# Patient Record
Sex: Male | Born: 1935 | Race: White | Hispanic: No | Marital: Single
Health system: Southern US, Community
[De-identification: ages and names within clinical notes are randomized; demographics above are authoritative.]

---

## 2010-09-10 ENCOUNTER — Ambulatory Visit: Payer: Self-pay | Admitting: Internal Medicine

## 2011-04-09 ENCOUNTER — Ambulatory Visit: Payer: Self-pay | Admitting: Internal Medicine

## 2012-10-30 ENCOUNTER — Emergency Department: Payer: Self-pay | Admitting: Internal Medicine

## 2012-10-30 LAB — COMPREHENSIVE METABOLIC PANEL
Anion Gap: 10 (ref 7–16)
Bilirubin,Total: 1 mg/dL (ref 0.2–1.0)
Calcium, Total: 9.5 mg/dL (ref 8.5–10.1)
Co2: 23 mmol/L (ref 21–32)
Creatinine: 0.85 mg/dL (ref 0.60–1.30)
EGFR (African American): >60
Glucose: 92 mg/dL (ref 65–99)
Osmolality: 279 (ref 275–301)
Potassium: 4.1 mmol/L (ref 3.5–5.1)
Sodium: 139 mmol/L (ref 136–145)
Total Protein: 7.8 g/dL (ref 6.4–8.2)

## 2012-10-30 LAB — CBC
HCT: 42.6 % (ref 40.0–52.0)
HGB: 14.5 g/dL (ref 13.0–18.0)
MCH: 34.1 pg — ABNORMAL HIGH (ref 26.0–34.0)
MCHC: 34 g/dL (ref 32.0–36.0)
WBC: 8.3 10*3/uL (ref 3.8–10.6)

## 2012-10-30 LAB — URINALYSIS, COMPLETE
Bacteria: NONE SEEN
Bilirubin,UR: NEGATIVE
Blood: NEGATIVE
Leukocyte Esterase: NEGATIVE
Nitrite: NEGATIVE
Ph: 7 (ref 4.5–8.0)
RBC,UR: 1 /HPF (ref 0–5)
Specific Gravity: 1.017 (ref 1.003–1.030)
WBC UR: 1 /HPF (ref 0–5)

## 2012-10-30 LAB — CK TOTAL AND CKMB (NOT AT ARMC)
CK, Total: 239 U/L — ABNORMAL HIGH (ref 35–232)
CK-MB: 3.3 ng/mL (ref 0.5–3.6)

## 2013-07-06 ENCOUNTER — Ambulatory Visit: Payer: Self-pay | Admitting: Internal Medicine

## 2013-08-01 ENCOUNTER — Inpatient Hospital Stay: Payer: Self-pay

## 2013-08-02 LAB — BASIC METABOLIC PANEL
Anion Gap: 7 (ref 7–16)
BUN: 39 mg/dL — AB (ref 7–18)
CALCIUM: 8.4 mg/dL — AB (ref 8.5–10.1)
CHLORIDE: 115 mmol/L — AB (ref 98–107)
CO2: 23 mmol/L (ref 21–32)
Creatinine: 1.56 mg/dL — ABNORMAL HIGH (ref 0.60–1.30)
GFR CALC AF AMER: 48 — AB
GFR CALC NON AF AMER: 42 — AB
Glucose: 94 mg/dL (ref 65–99)
Osmolality: 298 (ref 275–301)
Potassium: 4.6 mmol/L (ref 3.5–5.1)
SODIUM: 145 mmol/L (ref 136–145)

## 2013-08-02 LAB — CBC WITH DIFFERENTIAL/PLATELET
BASOS ABS: 0 10*3/uL (ref 0.0–0.1)
BASOS PCT: 0.4 %
EOS ABS: 0.1 10*3/uL (ref 0.0–0.7)
Eosinophil %: 1.6 %
HCT: 32.5 % — ABNORMAL LOW (ref 40.0–52.0)
HGB: 10.7 g/dL — AB (ref 13.0–18.0)
LYMPHS PCT: 15.5 %
Lymphocyte #: 1 10*3/uL (ref 1.0–3.6)
MCH: 35.3 pg — ABNORMAL HIGH (ref 26.0–34.0)
MCHC: 32.9 g/dL (ref 32.0–36.0)
MCV: 107 fL — ABNORMAL HIGH (ref 80–100)
MONO ABS: 1 x10 3/mm (ref 0.2–1.0)
Monocyte %: 15.4 %
NEUTROS ABS: 4.3 10*3/uL (ref 1.4–6.5)
Neutrophil %: 67.1 %
Platelet: 98 10*3/uL — ABNORMAL LOW (ref 150–440)
RBC: 3.03 10*6/uL — AB (ref 4.40–5.90)
RDW: 13.4 % (ref 11.5–14.5)
WBC: 6.4 10*3/uL (ref 3.8–10.6)

## 2013-08-02 LAB — PROTIME-INR
INR: 1.4
PROTHROMBIN TIME: 16.8 s — AB (ref 11.5–14.7)

## 2013-08-03 LAB — BASIC METABOLIC PANEL
ANION GAP: 7 (ref 7–16)
BUN: 37 mg/dL — ABNORMAL HIGH (ref 7–18)
CHLORIDE: 115 mmol/L — AB (ref 98–107)
CREATININE: 1.2 mg/dL (ref 0.60–1.30)
Calcium, Total: 8.2 mg/dL — ABNORMAL LOW (ref 8.5–10.1)
Co2: 23 mmol/L (ref 21–32)
EGFR (African American): >60
EGFR (Non-African Amer.): 57 — ABNORMAL LOW
Glucose: 113 mg/dL — ABNORMAL HIGH (ref 65–99)
OSMOLALITY: 298 (ref 275–301)
Potassium: 4.2 mmol/L (ref 3.5–5.1)
SODIUM: 145 mmol/L (ref 136–145)

## 2013-08-04 LAB — BASIC METABOLIC PANEL
Anion Gap: 5 — ABNORMAL LOW (ref 7–16)
BUN: 33 mg/dL — ABNORMAL HIGH (ref 7–18)
CALCIUM: 8.4 mg/dL — AB (ref 8.5–10.1)
CREATININE: 0.98 mg/dL (ref 0.60–1.30)
Chloride: 116 mmol/L — ABNORMAL HIGH (ref 98–107)
Co2: 25 mmol/L (ref 21–32)
EGFR (African American): >60
EGFR (Non-African Amer.): >60
Glucose: 124 mg/dL — ABNORMAL HIGH (ref 65–99)
OSMOLALITY: 299 (ref 275–301)
POTASSIUM: 4 mmol/L (ref 3.5–5.1)
SODIUM: 146 mmol/L — AB (ref 136–145)

## 2013-08-04 LAB — HEPATIC FUNCTION PANEL A (ARMC)
AST: 107 U/L — AB (ref 15–37)
Albumin: 2.3 g/dL — ABNORMAL LOW (ref 3.4–5.0)
Alkaline Phosphatase: 97 U/L
Bilirubin, Direct: 0.5 mg/dL — ABNORMAL HIGH (ref 0.00–0.20)
Bilirubin,Total: 1.1 mg/dL — ABNORMAL HIGH (ref 0.2–1.0)
SGPT (ALT): 53 U/L (ref 12–78)
Total Protein: 6.6 g/dL (ref 6.4–8.2)

## 2013-08-04 LAB — AMMONIA: Ammonia, Plasma: 95 mcmol/L — ABNORMAL HIGH (ref 11–32)

## 2013-08-05 ENCOUNTER — Ambulatory Visit: Payer: Self-pay | Admitting: Internal Medicine

## 2013-08-05 LAB — BASIC METABOLIC PANEL
ANION GAP: 5 — AB (ref 7–16)
BUN: 31 mg/dL — ABNORMAL HIGH (ref 7–18)
Calcium, Total: 8.4 mg/dL — ABNORMAL LOW (ref 8.5–10.1)
Chloride: 115 mmol/L — ABNORMAL HIGH (ref 98–107)
Co2: 26 mmol/L (ref 21–32)
Creatinine: 0.95 mg/dL (ref 0.60–1.30)
EGFR (Non-African Amer.): >60
GLUCOSE: 120 mg/dL — AB (ref 65–99)
Osmolality: 298 (ref 275–301)
POTASSIUM: 4 mmol/L (ref 3.5–5.1)
Sodium: 146 mmol/L — ABNORMAL HIGH (ref 136–145)

## 2013-08-06 LAB — BASIC METABOLIC PANEL
Anion Gap: 5 — ABNORMAL LOW (ref 7–16)
BUN: 31 mg/dL — AB (ref 7–18)
Calcium, Total: 8.6 mg/dL (ref 8.5–10.1)
Chloride: 115 mmol/L — ABNORMAL HIGH (ref 98–107)
Co2: 24 mmol/L (ref 21–32)
Creatinine: 0.92 mg/dL (ref 0.60–1.30)
GLUCOSE: 120 mg/dL — AB (ref 65–99)
OSMOLALITY: 295 (ref 275–301)
POTASSIUM: 4.2 mmol/L (ref 3.5–5.1)
Sodium: 144 mmol/L (ref 136–145)

## 2013-08-07 ENCOUNTER — Ambulatory Visit: Payer: Self-pay | Admitting: Urology

## 2013-08-07 LAB — BASIC METABOLIC PANEL
ANION GAP: 5 — AB (ref 7–16)
BUN: 35 mg/dL — ABNORMAL HIGH (ref 7–18)
CALCIUM: 8.4 mg/dL — AB (ref 8.5–10.1)
CREATININE: 1.24 mg/dL (ref 0.60–1.30)
Chloride: 115 mmol/L — ABNORMAL HIGH (ref 98–107)
Co2: 26 mmol/L (ref 21–32)
EGFR (African American): >60
EGFR (Non-African Amer.): 55 — ABNORMAL LOW
Glucose: 122 mg/dL — ABNORMAL HIGH (ref 65–99)
Osmolality: 300 (ref 275–301)
Potassium: 4.1 mmol/L (ref 3.5–5.1)
Sodium: 146 mmol/L — ABNORMAL HIGH (ref 136–145)

## 2013-08-07 LAB — CBC WITH DIFFERENTIAL/PLATELET
BASOS ABS: 0 10*3/uL (ref 0.0–0.1)
Basophil %: 0.4 %
EOS ABS: 0.1 10*3/uL (ref 0.0–0.7)
Eosinophil %: 1.3 %
HCT: 34.9 % — ABNORMAL LOW (ref 40.0–52.0)
HGB: 11.2 g/dL — ABNORMAL LOW (ref 13.0–18.0)
Lymphocyte #: 1 10*3/uL (ref 1.0–3.6)
Lymphocyte %: 10.3 %
MCH: 34.7 pg — AB (ref 26.0–34.0)
MCHC: 32 g/dL (ref 32.0–36.0)
MCV: 109 fL — ABNORMAL HIGH (ref 80–100)
Monocyte #: 1.3 x10 3/mm — ABNORMAL HIGH (ref 0.2–1.0)
Monocyte %: 13.9 %
Neutrophil #: 6.8 10*3/uL — ABNORMAL HIGH (ref 1.4–6.5)
Neutrophil %: 74.1 %
Platelet: 136 10*3/uL — ABNORMAL LOW (ref 150–440)
RBC: 3.22 10*6/uL — AB (ref 4.40–5.90)
RDW: 13.3 % (ref 11.5–14.5)
WBC: 9.2 10*3/uL (ref 3.8–10.6)

## 2013-08-08 LAB — BASIC METABOLIC PANEL
Anion Gap: 5 — ABNORMAL LOW (ref 7–16)
BUN: 43 mg/dL — ABNORMAL HIGH (ref 7–18)
CALCIUM: 8.7 mg/dL (ref 8.5–10.1)
Chloride: 117 mmol/L — ABNORMAL HIGH (ref 98–107)
Co2: 26 mmol/L (ref 21–32)
Creatinine: 1.3 mg/dL (ref 0.60–1.30)
EGFR (African American): >60
GFR CALC NON AF AMER: 52 — AB
Glucose: 120 mg/dL — ABNORMAL HIGH (ref 65–99)
OSMOLALITY: 306 (ref 275–301)
Potassium: 4.1 mmol/L (ref 3.5–5.1)
SODIUM: 148 mmol/L — AB (ref 136–145)

## 2013-09-05 ENCOUNTER — Ambulatory Visit: Payer: Self-pay | Admitting: Internal Medicine

## 2013-09-05 DEATH — deceased

## 2014-02-12 IMAGING — CT CT CERVICAL SPINE WITHOUT CONTRAST
1 series · 12 of 14 positions shown, 15 images · non-contrast
Comparison: none

REASON FOR EXAM: pain l leg
COMMENTS:

PROCEDURE:     CT  - CT CERVICAL SPINE WO  - October 30, 2012  [DATE]
RESULT:     CT cervical spine
TECHNIQUE: Helical 2 mm sections were obtained. Reconstructions were
performed utilizing a bone algorithm in coronal, sagittal, and axial planes.

[Series 5: axial · axial · 0.32mm/px · z∈[-383,-218]mm · 12 of 111 slices shown, 15 images]
[im 9/111  soft-tissue]
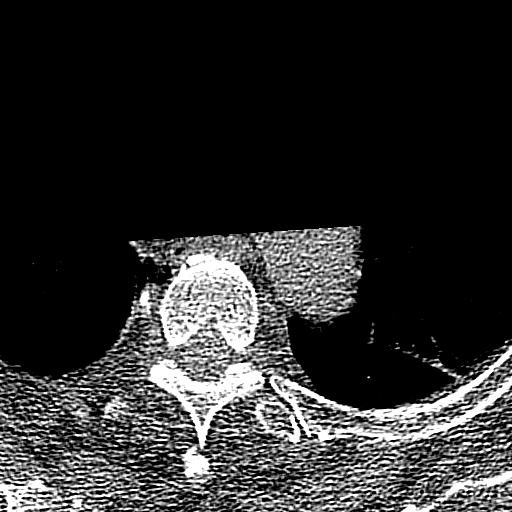
[im 9/111  bone]
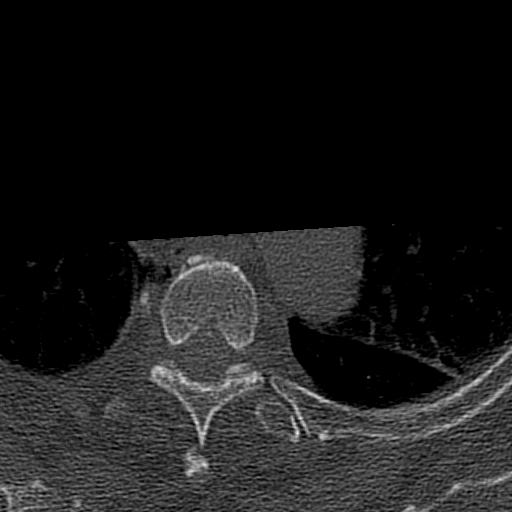
[im 17/111  bone]
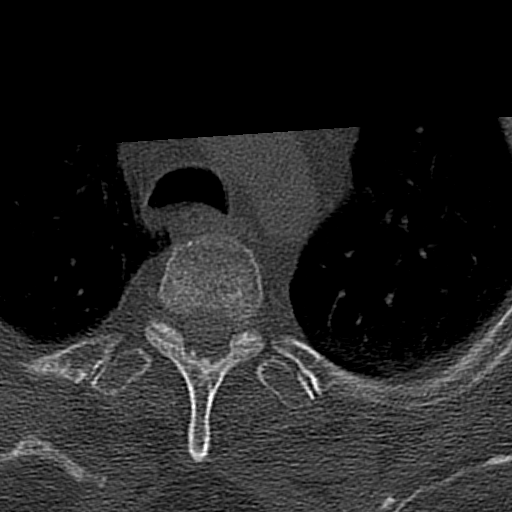
[im 26/111  bone]
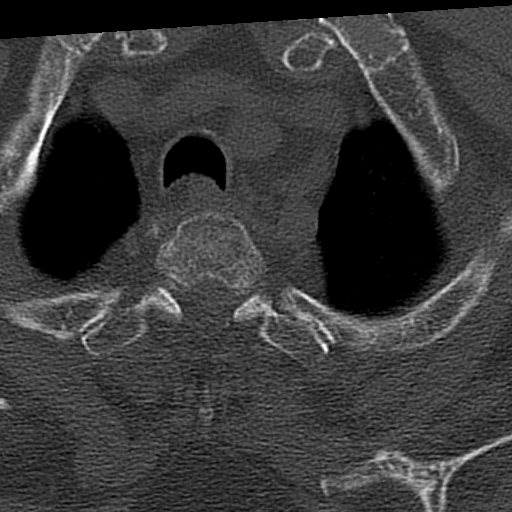
[im 34/111  bone]
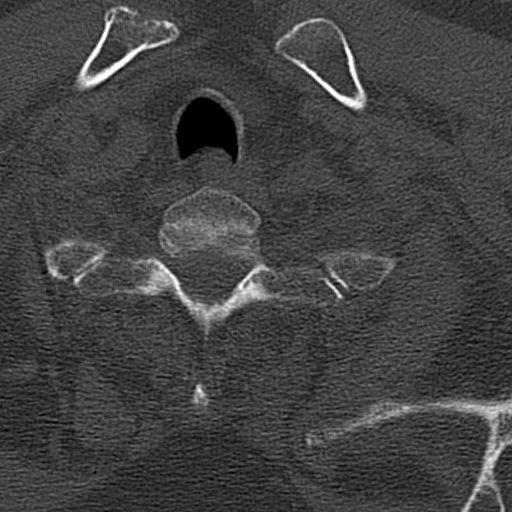
[im 43/111  soft-tissue]
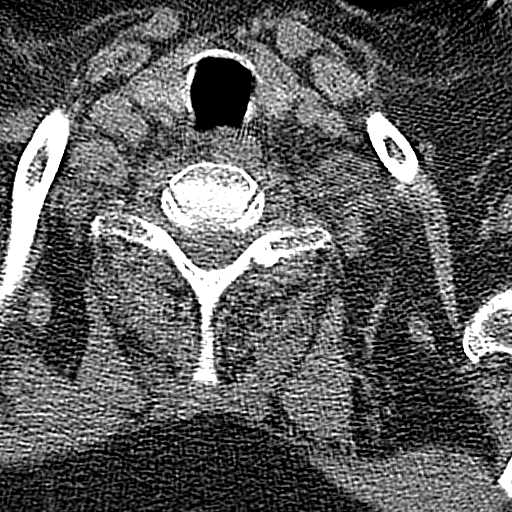
[im 43/111  bone]
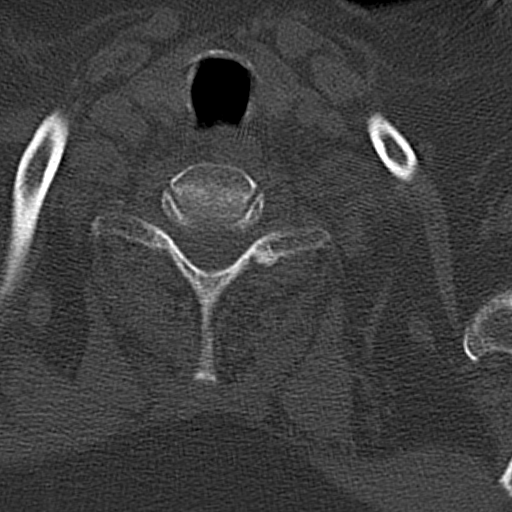
[im 51/111  bone]
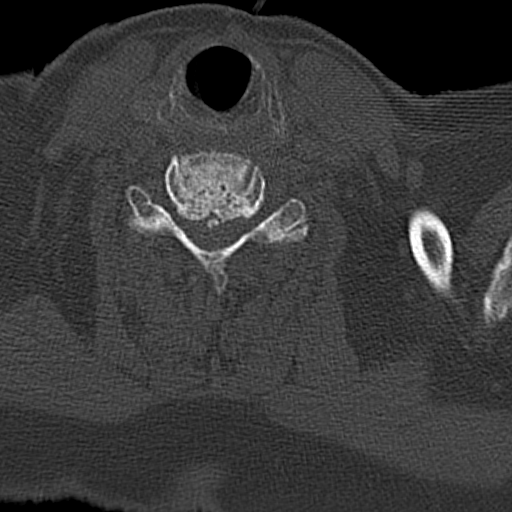
[im 60/111  bone]
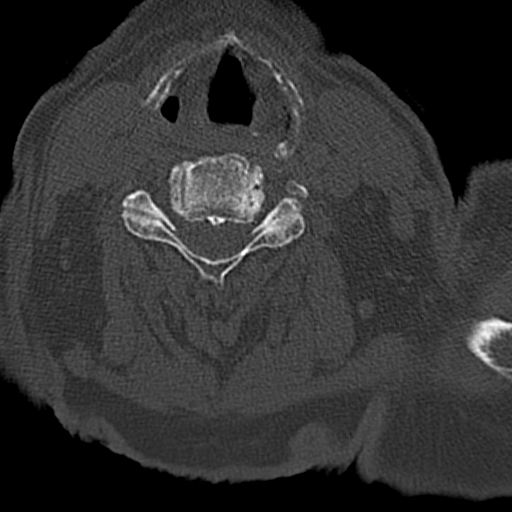
[im 68/111  bone]
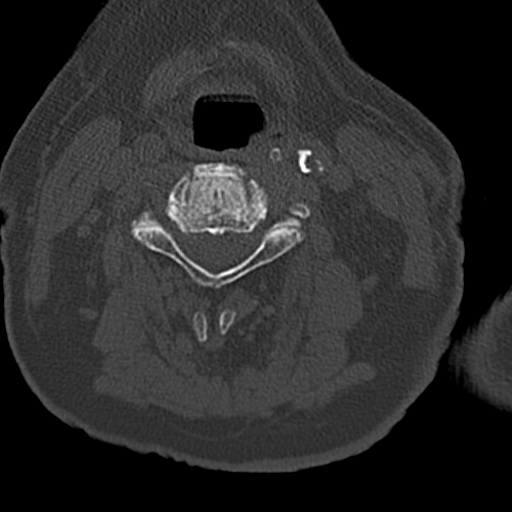
[im 77/111  soft-tissue]
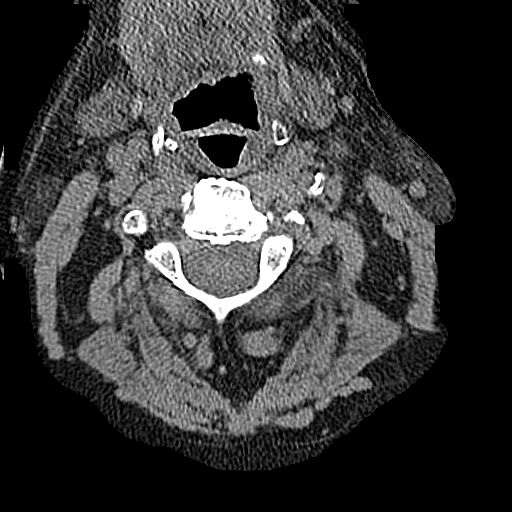
[im 77/111  bone]
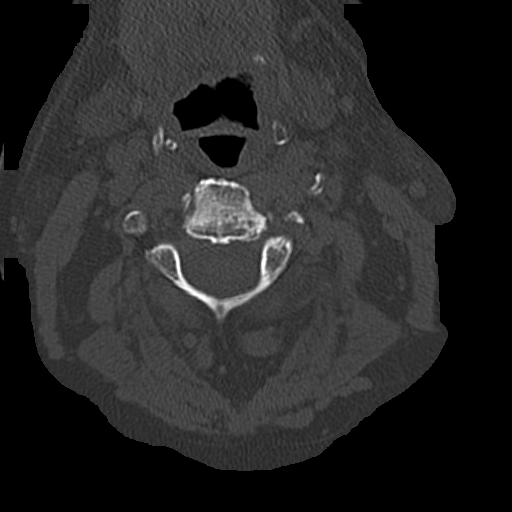
[im 85/111  bone]
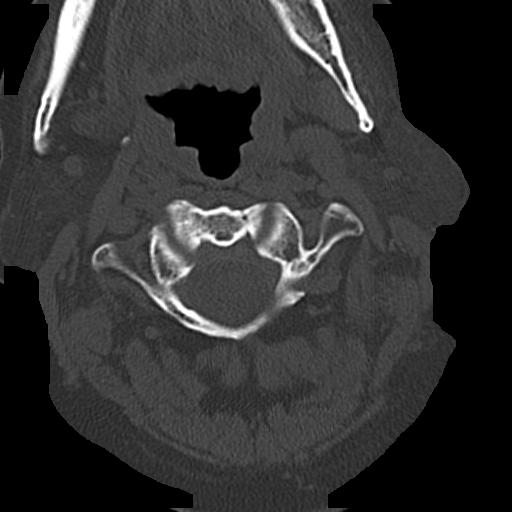
[im 94/111  bone]
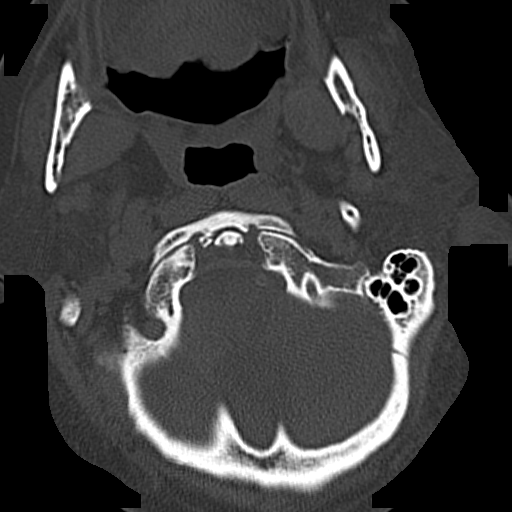
[im 102/111  bone]
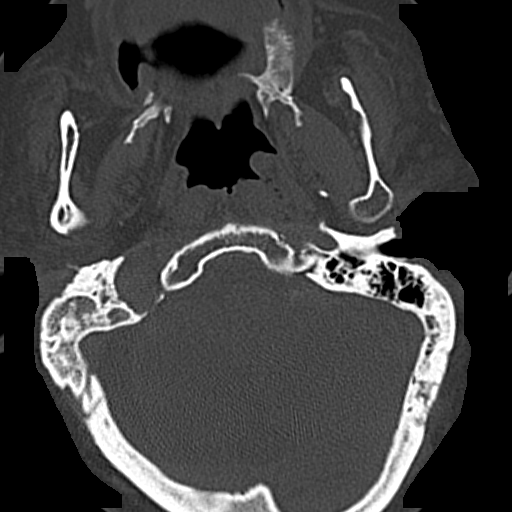

[12 of 14 positions shown; findings below may reference images not displayed]

FINDINGS: There is no evidence of acute fracture, dislocation, or
malalignment. There is no evidence of prevertebral soft tissue swelling nor
evidence of canal stenosis. Multilevel degenerative disc disease changes are
appreciated. Areas of calcification along the posterior longitudinal
ligament.
IMPRESSION: No CT evidence of acute osseous abnormalities.
2. Multilevel spondylolysis

## 2014-07-29 NOTE — Discharge Summary (Signed)
PATIENT NAME:  Phillip Owen, Phillip Owen MR#:  [id number] DATE OF BIRTH:  13-Oct-1932  DATE OF ADMISSION:  08/01/2013 DATE OF DISCHARGE:  08/08/2013  DISCHARGE DIAGNOSES: 1.  Cirrhosis.  2.  Dysphagia.  3.  Urinary retention.  4.  Anasarca.   HISTORY OF PRESENT ILLNESS: Please see initial history and physical for details. Briefly, this is a 81 year old mentally retarded gentleman who was admitted with increasing edema. It was felt initially that it might be due to CHF, but echocardiogram was normal, relatively. He was found to have cirrhosis based on imaging.   HOSPITAL COURSE BY ISSUE: Edema: The patient was gently diuresed; however, he continued to have anasarca. Due to progressive decline over the hospitalization, he was seen by speech and swallow and put on a mechanical soft diet, but he had very poor intake. The family met with palliative care and it was felt given the patient's progressive decline and his bedbound status he would not be a great candidate for extensive work-up of the cirrhosis, which was likely irreversible. He was also not a rehab candidate or candidate for SNF per physical therapy. The patient was discharged home on May 4th with home hospice.   DISCHARGE MEDICATIONS: Please see [facility] discharge summary hospice home admit orders.   DISCHARGE FOLLOWUP: The patient will be followed by hospice at home.   DISCHARGE CODE STATUS: DNR.   TIME SPENT: 40 minutes.  ____________________________ Cheral Marker. Ola Spurr, MD dpf:sb D: 08/25/2013 06:57:14 ET T: 08/25/2013 09:26:49 ET JOB#: [id number]  cc: Cheral Marker. Ola Spurr, MD, <Dictator> Terrill Wauters Ola Spurr MD ELECTRONICALLY SIGNED 08/28/2013 19:31

## 2014-07-29 NOTE — Consult Note (Signed)
Admit Diagnosis:   CHF: Onset Date: 07-Aug-2013, Status: Active, Description: CHF  Home Medications: Medication Instructions Status  omeprazole 20 mg oral delayed release capsule 1 cap(s) orally once a day Active  LORazepam 0.5 mg oral tablet 1 tab(s) orally every 4 hours as needed for anxiety. Active  loratadine 10 mg oral tablet 1 tab(s) orally once a day Active  meloxicam 7.5 mg oral tablet 1 tab(s) orally once a day Active  potassium chloride 10 mEq oral capsule, extended release 1 cap(s) orally 2 times a day Active  sertraline 25 mg oral tablet 1 tab(s) orally once a day Active  Vitamin B12 1000 mcg oral tablet 1 tab(s) orally once a day Active  Baza-Protect - topical cream Apply topically to affected area , As Needed with each diaper change. Active  Lasix 20 mg oral tablet 1  orally every 48 hours Active  Robitussin 10 milliliter(s) orally , As Needed Active  lisinopril 40 mg oral tablet 1 tab(s) orally once a day Active  hydrochlorothiazide 12.5 mg oral capsule 1 cap(s) orally once a day for fluid/edema. Active  fluticasone nasal 50 mcg/inh nasal spray 2 spray(s) in each nostril once a day Active  finasteride 5 mg oral tablet 1 tab(s) orally once a day for prostate. *do not crush -wear gloves when handling* Active  Lubriderm Daily Moisturizer - topical lotion Apply topically to both lower legs 2 times a day. Active  Travatan Z 0.004% ophthalmic solution 1 drop(s) in each eye once a day (at night) for glaucoma. Active  simvastatin 10 mg oral tablet 1 tab(s) orally once a day for cholesterol. Active  acetaminophen 500 mg oral tablet 2 tab(s) orally 3 times a day Active  tolterodine 2 mg oral tablet 1 tab(s) orally 2 times a day Active  dorzolamide-timolol ophthalmic 2.23%-0.68% ophthalmic solution 1 drop(s) into each eye 2 times a day. Active   Lab Results: Routine Chem:  03-May-15 04:56   Glucose, Serum  122  BUN  35  Creatinine (comp) 1.24  Sodium, Serum  146  Potassium,  Serum 4.1  Chloride, Serum  115  CO2, Serum 26  Calcium (Total), Serum  8.4  Anion Gap  5  Osmolality (calc) 300  eGFR (African American) >60  eGFR (Non-African American)  55 (eGFR values <90m/min/1.73 m2 may be an indication of chronic kidney disease (CKD). Calculated eGFR is useful in patients with stable renal function. The eGFR calculation will not be reliable in acutely ill patients when serum creatinine is changing rapidly. It is not useful in  patients on dialysis. The eGFR calculation may not be applicable to patients at the low and high extremes of body sizes, pregnant women, and vegetarians.)  Routine Hem:  03-May-15 04:56   WBC (CBC) 9.2  RBC (CBC)  3.22  Hemoglobin (CBC)  11.2  Hematocrit (CBC)  34.9  Platelet Count (CBC)  136  MCV  109  MCH  34.7  MCHC 32.0  RDW 13.3  Neutrophil % 74.1  Lymphocyte % 10.3  Monocyte % 13.9  Eosinophil % 1.3  Basophil % 0.4  Neutrophil #  6.8  Lymphocyte # 1.0  Monocyte #  1.3  Eosinophil # 0.1  Basophil # 0.0 (Result(s) reported on 07 Aug 2013 at 05:40AM.)   Radiology Results:  Radiology Results: [place]    29-Apr-15 17:17, [place]  [place]  REASON FOR EXAM:    Routine for edema, thrombocytopenia, low alb,   cirrhosis  COMMENTS:  PROCEDURE: [place]  - US ABDOMEN GENERAL SURVEY  - Aug 03 2013  5:17PM     CLINICAL DATA:  Cirrhosis, thrombocytopenia, edema.    EXAM:  ULTRASOUND ABDOMEN COMPLETE    COMPARISON:  None.    FINDINGS:  Gallbladder:  Visualization is difficult due to the patient's body habitus. In the  expected location of the gallbladder, there are echogenic foci which  could be from bowel gas or gallstones.    Common bile duct:    Diameter: 5.9 mm.    Liver:    No focal lesion identified. There is nodular contour of the liver  consistent with known cirrhosis.    IVC:  No abnormality visualized.    Pancreas:    Not visualized.    Spleen:    Size and  appearance within normal limits.    Right Kidney:    Length: 9.5 cm. Echogenicity within normal limits. No mass or  hydronephrosis visualized.  Left Kidney:    Length: 9.8 cm. Echogenicity within normal limits. No mass or  hydronephrosis visualized.    Abdominal aorta:    Not visualized.    Other findings:    Four quadrant ascites is noted.     IMPRESSION:  Suboptimal exam due to the patient's body habitus and inability to  cooperate with the exam. In the expected location of the  gallbladder, there are echogenic foci which could be from bowel gas  or gallstones.    Cirrhosis of liver with 4 quadrant ascites.      Electronically Signed    By: Abelardo Diesel M.D.    On: 08/03/2013 17:23         Verified By: Abelardo Diesel, M.D.,  LabUnknown:  PACS Image    No Known Allergies:   Nursing Flowsheets: **Vital Signs.:   03-May-15 13:57  Vital Signs Type Routine  Temperature Temperature (F) 97.6  Celsius 36.4  Pulse Pulse 89  Respirations Respirations 19  Systolic BP Systolic BP 93  Mean BP 72  Pulse Ox % Pulse Ox % 93  Pulse Ox Activity Level  At rest  Oxygen Delivery Room Air/ 21 %  *Intake and Output.:   03-May-15 12:00  Unmeasured Intake  Sips  Percentage of Meal Eaten  50    Present Illness 81 you with no pertinen GU history per family.  Nursing replaced foley secondary to leakage around last night.  Now not draining.  Had nurse remove.  Bladder scan with foley was >500.  Asked to assess.  Pt. MR and gives limited history.  No history of GU surgery/prostate issues.  He has no voiding complaints.   Case History and Physical Exam:  Past Surgical History None   Family History Non-Contributory   HEENT PERLA   Neck/Nodes Supple  No Adenopathy   Chest/Lungs Clear   Cardiovascular 2+ pulses   Abdomen Benign  bladder nonpalpable   Genitalia WNL   Rectal Not examined   Musculoskeletal Full range of motion   Neurological Grossly WNL   Skin Warm   Dry    Impression 81 with foley diplacement.  Foley removed.  18Fr coude place, likely BPH, 10 ml sterile H20 in balloon.  Irrigated clear with 60cc NS.  Draining clear.   Plan 1) cont. foley per primary team 2) if displaced again would leave out/condom catheter, bladder scans 3) call with additional concerns.   Electronic Signatures: Felicity Coyer (MD)  (Signed 03-May-15 14:50)  Authored: Health Issues, Home Medications, Labs, Radiology  Results, Allergies, Vital Signs, General Aspect/Present Illness, History and Physical Exam, Impression/Plan   Last Updated: 03-May-15 14:50 by Felicity Coyer (MD)

## 2014-07-29 NOTE — H&P (Signed)
PATIENT NAME:  Phillip Owen, Phillip Owen MR#:  [id number] DATE OF BIRTH:  [birth year]  DATE OF ADMISSION:  08/01/2013  PRIMARY CARE PHYSICIAN:  Dr. Adrian Prows.   REASON FOR ADMISSION: Congestive heart failure.  HISTORY OF PRESENT ILLNESS: Phillip Owen is a 81 year old male with medical history significant for hypertension, hyperlipidemia, mental retardation, osteoarthritis who presented to [facility] today for evaluation of 2-week history of increasing cough and edema. The patient has moderate mental retardation and lives in [place] where he is cared for. He has a sister and niece who also help to care for him on a regular basis. Over the past several weeks, the patient has become less able to stand on his own and has become unable to transfer self. He has had increasing cough, which is nonproductive. He is more short of breath with any exertion. He has had increasing edema over the past several weeks and now edema appears to be in the abdomen as well. He has not had any fevers or chills. He now has significant groin rash, as well with bleeding. History is mostly from the niece. He has not complained of any chest pain. Appetite has been significantly decreased. They have not been able to weigh him in the past several weeks due to his inability to stand independently. On presentation, he was found to be dyspneic with cough. Chest x-ray demonstrated significant pulmonary edema with left-sided effusion. Due to the pulmonary edema, as well as his inability to be cared for in the assisted living and his inability to transfer, the patient will be admitted for further diuresis and assessment of physical status.   PAST MEDICAL HISTORY: 1.  Hypertension.  2.  Hyperlipidemia.  3.  Mental retardation with history of traumatic brain injury as a child.  4.  Osteoarthritis.   PAST SURGICAL HISTORY:  None.  CURRENT MEDICATIONS: From [facility] provided by [facility]: 1.  Sertraline 25 mg  1 p.o. daily. 2.  Vitamin B12 1000 mcg 1 p.o. daily. 3.  Dorzolamide/timolol eyedrops 1 drop into both eyes twice a day. 4.  Potassium chloride ER 10 mEq 1 p.o. b.i.d.  5.  Detrol 2 mg 1 p.o. b.i.d.  6.  Acetaminophen 500 mg 2 p.o. t.i.d.  7.  Baza protective cream p.r.n.  8.  Simvastatin 10 mg 1 p.o. at bedtime.  9.  Proscar 5 mg 1 p.o. daily.  10.  Flonase 2 sprays each side once a day.  11.  Lasix 40 mg half p.o. every other day. 12.  Hydrochlorothiazide 12.5 mg daily.  13.  Lisinopril 40 mg 1 p.o. daily.  14.  Loratadine 10 mg 1 p.o. daily.  15.  Meloxicam 7.5 mg daily.  16.  Omeprazole 20 mg 1 p.o. daily.  17.  Travatan Z eyedrops 1 drop in each eye at night for glaucoma.  18.  Lorazepam 0.5 mg q. 4 hours p.r.n. anxiety.  19.  Milk of Magnesia 30 mL p.r.n. for constipation.   ALLERGIES: No known drug allergies.   SOCIAL HISTORY: The patient does not drink or smoke. Lives in [place].   FAMILY HISTORY: Noncontributory.   PHYSICAL EXAMINATION: GENERAL: Elderly male in a wheelchair.  HEENT: Eyes are closed. Posterior oropharynx is erythematous with dry buccal mucosa.  HEART: Regular rate and rhythm with 2/6 systolic murmur heard best at the left upper sternal border.  LUNGS: Diminished breath sounds throughout with coarse rhonchi throughout.  ABDOMEN: Obese, distended but soft and nontender. Unable to examine  patient's groin due to his inability to stand; therefore, could not examine the rash.  EXTREMITIES: 3+ pitting edema to the knees bilaterally.   LABORATORY DATA: CBC shows hemoglobin 12.8, hematocrit 39.1, white blood cell count 9.4. Hepatic panel: Albumin is 2.8, alk phos 84, direct bilirubin 0.4, AST 49, ALT 27. Metabolic panel: BUN is 37, creatinine 1.4, calcium 8.5, CO2 of 23.3, glucose 104, potassium 4.3. Sodium 137, chloride 113, estimated GFR is 49. BNP is pending. TSH 0.738.   Chest x-ray shows cardiomegaly with left-sided effusion, increased  vasculature throughout with pulmonary edema.   CLINICAL IMPRESSION: 1.  Congestive heart failure.  2.  Increased weakness.  3.  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  4.  Disposition need for higher care.   PLAN: 1.  We will admit patient for diuresis.  2.  The patient will likely need Foley catheter placed as he is already incontinent.  3.  Will monitor renal function closely given baseline renal insufficiency.  4.  Monitor vital signs carefully with prior renal insufficiency with diuretics.  5.  We will obtain cardiology consult to evaluate cardiac function.  6.  We will obtain an echocardiogram. This was attempted on an outpatient basis; however, the patient prior could not lay on his left side for this procedure.  7.  Monitor agitation closely as family is very concerned about his mental status while hospitalized due to the change in his underlying disabilities.  8.  We will have social work evaluate for further placement needs. May need a higher level of care than assisted living. This patient was seen, re-examined and all medical decisions were made by Dr. Adrian Prows.   ____________________________ Paulita Cradle, PA-C mm:ce D: 08/01/2013 17:41:29 ET T: 08/01/2013 18:12:43 ET JOB#: [id number]  cc: Paulita Cradle, PA-C, <Dictator> Jazen Spraggins [facility] PA ELECTRONICALLY SIGNED 09/13/2013 13:42

## 2014-07-29 NOTE — Consult Note (Signed)
   Comments   I met with pt's sister and niece. Updated family on pt's status. They tell me he has declined recently. At baseline, he is wheelchair bound and requires maximum assistance from ALF staff with daily care. Plan is for him to go to a SNF for long term care upon discharge. Sister says family has talked and they do not want any invasive workup or aggresive testing (ie. bx). Sister recognizes that patient may continue to decline and could be approaching end of life. We talked about the option of hospice involvement. She would appreciate their involvement and knows she can request hospice at the SNF. We also discussed code status. Family does not feel that resuscitation or intubation would be the right choice for patient. Family requests DNR. Will place portable form on the chart. palliative care follow at SNF with consideration for hospice   Electronic Signatures: Borders, Kirt Boys (NP)  (Signed 30-Apr-15 15:54)  Authored: Palliative Care Phifer, Izora Gala (MD)  (Signed 30-Apr-15 21:52)  Authored: Palliative Care   Last Updated: 30-Apr-15 21:52 by Phifer, Izora Gala (MD)

## 2014-11-17 IMAGING — CR DG CHEST 1V
1 series · 1 of 1 positions shown · non-contrast
Comparison: None.

CLINICAL DATA: Productive cough

EXAM:
CHEST - 1 VIEW

[ap]
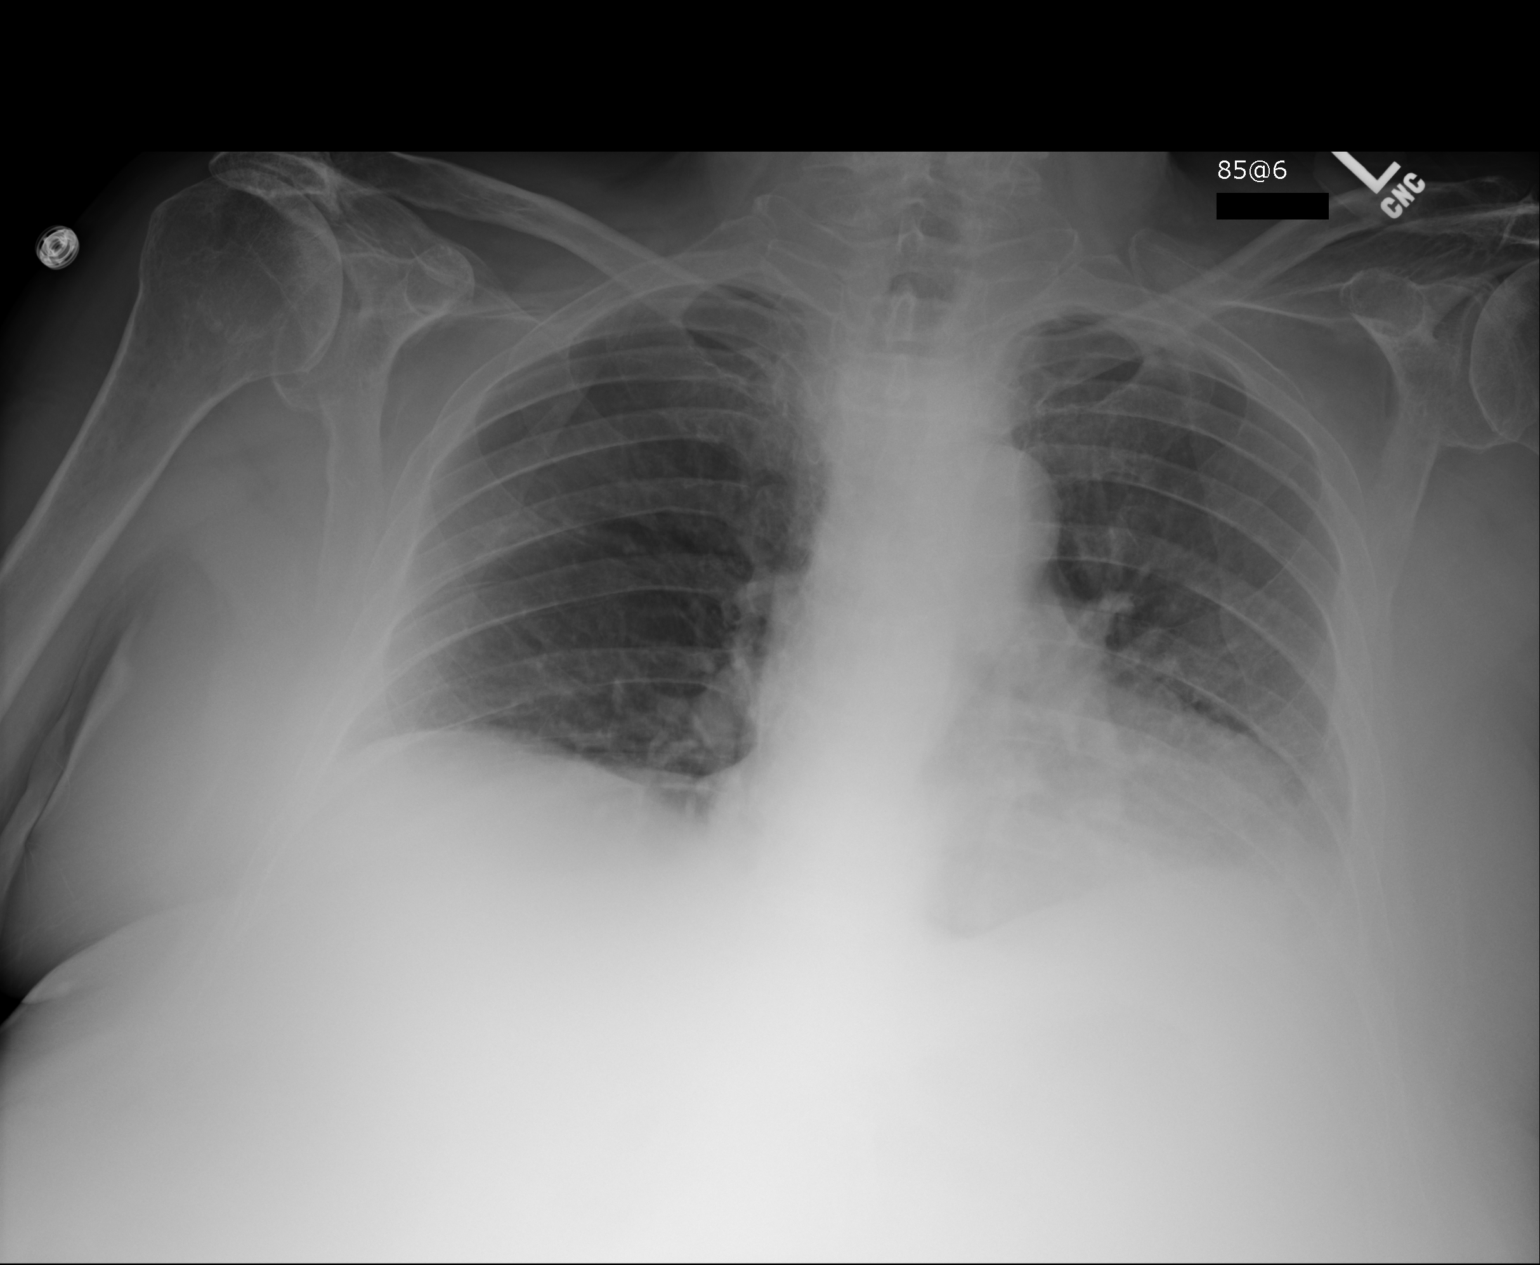

[1 of 1 positions shown; findings below may reference images not displayed]

FINDINGS: Low volumes. Bibasilar atelectasis. Upper lungs clear. Normal heart
size allowing for low volumes and AP technique.
IMPRESSION: Bibasilar atelectasis.
